# Patient Record
Sex: Female | Born: 1990 | Race: Black or African American | Hispanic: No | Marital: Single | Smoking: Never smoker
Health system: Southern US, Community
[De-identification: ages and names within clinical notes are randomized; demographics above are authoritative.]

## PROBLEM LIST (undated history)

## (undated) ENCOUNTER — Inpatient Hospital Stay (HOSPITAL_COMMUNITY): Payer: Self-pay

---

## 2016-09-21 ENCOUNTER — Encounter (HOSPITAL_COMMUNITY): Payer: Self-pay | Admitting: *Deleted

## 2016-09-21 ENCOUNTER — Inpatient Hospital Stay (HOSPITAL_COMMUNITY)
Admission: AD | Admit: 2016-09-21 | Discharge: 2016-09-21 | Disposition: A | Discharge status: Home / Self Care | Payer: Self-pay | Source: Ambulatory Visit | Attending: Family Medicine | Admitting: Family Medicine

## 2016-09-21 ENCOUNTER — Inpatient Hospital Stay (HOSPITAL_COMMUNITY): Payer: Self-pay

## 2016-09-21 DIAGNOSIS — Z3A08 8 weeks gestation of pregnancy: Secondary | ICD-10-CM | POA: Insufficient documentation

## 2016-09-21 DIAGNOSIS — O209 Hemorrhage in early pregnancy, unspecified: Secondary | ICD-10-CM

## 2016-09-21 DIAGNOSIS — O418X10 Other specified disorders of amniotic fluid and membranes, first trimester, not applicable or unspecified: Secondary | ICD-10-CM

## 2016-09-21 DIAGNOSIS — O98811 Other maternal infectious and parasitic diseases complicating pregnancy, first trimester: Secondary | ICD-10-CM | POA: Insufficient documentation

## 2016-09-21 DIAGNOSIS — B3731 Acute candidiasis of vulva and vagina: Secondary | ICD-10-CM

## 2016-09-21 DIAGNOSIS — O468X1 Other antepartum hemorrhage, first trimester: Secondary | ICD-10-CM

## 2016-09-21 DIAGNOSIS — O4691 Antepartum hemorrhage, unspecified, first trimester: Secondary | ICD-10-CM | POA: Insufficient documentation

## 2016-09-21 DIAGNOSIS — B373 Candidiasis of vulva and vagina: Secondary | ICD-10-CM | POA: Insufficient documentation

## 2016-09-21 DIAGNOSIS — Z3491 Encounter for supervision of normal pregnancy, unspecified, first trimester: Secondary | ICD-10-CM

## 2016-09-21 LAB — WET PREP, GENITAL
Clue Cells Wet Prep HPF POC: NONE SEEN
SPERM: NONE SEEN
TRICH WET PREP: NONE SEEN

## 2016-09-21 LAB — URINALYSIS, ROUTINE W REFLEX MICROSCOPIC
BILIRUBIN URINE: NEGATIVE
Glucose, UA: NEGATIVE mg/dL
Ketones, ur: NEGATIVE mg/dL
Nitrite: NEGATIVE
Protein, ur: NEGATIVE mg/dL
SPECIFIC GRAVITY, URINE: 1.005 (ref 1.005–1.030)
pH: 7 (ref 5.0–8.0)

## 2016-09-21 LAB — POCT PREGNANCY, URINE: PREG TEST UR: POSITIVE — AB

## 2016-09-21 LAB — CBC
HEMATOCRIT: 43.6 % (ref 36.0–46.0)
HEMOGLOBIN: 14.4 g/dL (ref 12.0–15.0)
MCH: 28.1 pg (ref 26.0–34.0)
MCHC: 33 g/dL (ref 30.0–36.0)
MCV: 85.2 fL (ref 78.0–100.0)
Platelets: 300 10*3/uL (ref 150–400)
RBC: 5.12 MIL/uL — AB (ref 3.87–5.11)
RDW: 14.4 % (ref 11.5–15.5)
WBC: 10.8 10*3/uL — AB (ref 4.0–10.5)

## 2016-09-21 LAB — HCG, QUANTITATIVE, PREGNANCY: hCG, Beta Chain, Quant, S: 157549 m[IU]/mL — ABNORMAL HIGH (ref <5)

## 2016-09-21 MED ORDER — TERCONAZOLE 0.4 % VA CREA: 1.0000 | TOPICAL_CREAM | Freq: Every day | VAGINAL | 0 refills | Status: AC

## 2016-09-21 NOTE — MAU Note (Addendum)
Woke up having to use the restroom,  Noted blood when wiped.was bright red, now is brownish.  +preg test  At a [facility] last Friday.  Planned to have an abortion next wk

## 2016-09-21 NOTE — MAU Provider Note (Signed)
History   CSN: [id number]  Arrival date and time: 09/21/16 1620   None        Chief Complaint  Patient presents with  . Vaginal Bleeding  . Possible Pregnancy   Pt is a 26 y/o female G1P0 @ [redacted]w[redacted]d who presents to MAU today c/o sudden onset of vaginal bleeding since earlier today ~1pm. Patient reports that she has not soaked through pads or tampons, and only notes the blood upon wiping. The blood was bright red at first, and then became brown in color. Denies fever at home. She reports mild lower left abd pain rated at 3-4/10, without nausea or vomiting nor radiation. She has an appointment next week for taking the abortion pill. Denies urinary sx.           OB History    Gravida Para Term Preterm AB Living   1             SAB TAB Ectopic Multiple Live Births                  No past medical history on file.  No past surgical history on file.  No family history on file.      Social History  Substance Use Topics  . Smoking status: Not on file  . Smokeless tobacco: Not on file  . Alcohol use Not on file    Allergies: Allergies not on file  No prescriptions prior to admission.    Review of Systems  Constitutional: Negative for chills and fever.  Respiratory: Negative for chest tightness and shortness of breath.   Cardiovascular: Negative for chest pain.  Gastrointestinal: Positive for abdominal pain. Negative for blood in stool, constipation, diarrhea, nausea and vomiting.  Genitourinary: Positive for vaginal bleeding. Negative for difficulty urinating, dysuria, hematuria, pelvic pain, vaginal discharge and vaginal pain.   Physical Exam   Blood pressure 117/67, pulse 74, temperature 98.4 F (36.9 C), temperature source Oral, resp. rate 18, weight 223 lb 12 oz (101.5 kg), last menstrual period 07/25/2016, SpO2 100 %.  Physical Exam  Constitutional: She is oriented to person, place, and time. She appears well-developed and well-nourished. No  distress.  HENT:  Head: Normocephalic and atraumatic.  Cardiovascular: Normal rate, regular rhythm, normal heart sounds and intact distal pulses.  Exam reveals no gallop and no friction rub.   No murmur heard. Respiratory: Effort normal and breath sounds normal. No respiratory distress. She has no wheezes. She has no rales. She exhibits no tenderness.  GI: Soft. She exhibits no distension and no mass. There is no tenderness. There is no rebound and no guarding.  Genitourinary: Pelvic exam was performed with patient supine. There is no rash, tenderness or lesion on the right labia. There is no rash, tenderness or lesion on the left labia. Cervix exhibits discharge (light brown). Cervix exhibits no motion tenderness and no friability. Right adnexum displays no mass and no tenderness. Left adnexum displays tenderness (mild). Left adnexum displays no mass. No erythema in the vagina. No foreign body in the vagina. No signs of injury around the vagina. Vaginal discharge (small amount of light brown discharge at introitus) found.  Musculoskeletal: Normal range of motion. She exhibits no edema.  Neurological: She is alert and oriented to person, place, and time.  Skin: Skin is warm and dry.  Psychiatric: She has a normal mood and affect. Her behavior is normal.    MAU Course  Procedures  MDM Will perform [place] to confirm IUP and r/o  ectopic pregnancy. Informed patient that we do not perform abortive services here.  Assessment and Plan  Patient is a 26 y/o female who presents to MAU for new onset vaginal bleeding in setting of 1st trimester pregnancy. Her PE was unremarkable, with her pelvic examination revealing a small amount of light brown bloody discharge from cervix. Wet prep showed presence of yeast.  Lucendia HerrlichLaura Gardner   I confirm that I have verified the information documented in the student PA's note and that I have also personally performed the physical exam and all medical decision making  activities.See below for continued work-up results.   Lab Results Last 24 Hours       Results for orders placed or performed during the hospital encounter of 09/21/16 (from the past 24 hour(s))  Urinalysis, Routine w reflex microscopic     Status: Abnormal   Collection Time: 09/21/16  4:39 PM  Result Value Ref Range   Color, Urine STRAW (A) YELLOW   APPearance CLEAR CLEAR   Specific Gravity, Urine 1.005 1.005 - 1.030   pH 7.0 5.0 - 8.0   Glucose, UA NEGATIVE NEGATIVE mg/dL   Hgb urine dipstick MODERATE (A) NEGATIVE   Bilirubin Urine NEGATIVE NEGATIVE   Ketones, ur NEGATIVE NEGATIVE mg/dL   Protein, ur NEGATIVE NEGATIVE mg/dL   Nitrite NEGATIVE NEGATIVE   Leukocytes, UA LARGE (A) NEGATIVE   RBC / HPF 0-5 0 - 5 RBC/hpf   WBC, UA 0-5 0 - 5 WBC/hpf   Bacteria, UA FEW (A) NONE SEEN   Squamous Epithelial / LPF 0-5 (A) NONE SEEN  Pregnancy, urine POC     Status: Abnormal   Collection Time: 09/21/16  4:45 PM  Result Value Ref Range   Preg Test, Ur POSITIVE (A) NEGATIVE  CBC     Status: Abnormal   Collection Time: 09/21/16  5:22 PM  Result Value Ref Range   WBC 10.8 (H) 4.0 - 10.5 K/uL   RBC 5.12 (H) 3.87 - 5.11 MIL/uL   Hemoglobin 14.4 12.0 - 15.0 g/dL   HCT 21.343.6 08.636.0 - 57.846.0 %   MCV 85.2 78.0 - 100.0 fL   MCH 28.1 26.0 - 34.0 pg   MCHC 33.0 30.0 - 36.0 g/dL   RDW 46.914.4 62.911.5 - 52.815.5 %   Platelets 300 150 - 400 K/uL  hCG, quantitative, pregnancy     Status: Abnormal   Collection Time: 09/21/16  5:22 PM  Result Value Ref Range   hCG, Beta Chain, Quant, S 157,549 (H) <5 mIU/mL  ABO/Rh     Status: None (Preliminary result)   Collection Time: 09/21/16  5:22 PM  Result Value Ref Range   ABO/RH(D) O POS   Wet prep, genital     Status: Abnormal   Collection Time: 09/21/16  6:45 PM  Result Value Ref Range   Yeast Wet Prep HPF POC PRESENT (A) NONE SEEN   Trich, Wet Prep NONE SEEN NONE SEEN   Clue Cells Wet Prep HPF POC NONE SEEN NONE SEEN    WBC, Wet Prep HPF POC MANY (A) NONE SEEN   Sperm NONE SEEN       [place] Ob Comp Less 14 Wks  Result Date: 09/21/2016 CLINICAL DATA:  Vaginal bleeding today. Positive urine pregnancy test. Quantitative beta HCG 157, 549. LMP was05/27/2018. Gestational age by LMP is8 weeks 2 days. EDC by LMP is03/04/2017. EXAM: OBSTETRIC <14 WK US AND TRANSVAGINAL OB US TECHNIQUE: Both transabdominal and transvaginal ultrasound examinations were performed for complete evaluation of the  gestation as well as the maternal uterus, adnexal regions, and pelvic cul-de-sac. Transvaginal technique was performed to assess early pregnancy. COMPARISON:  None. FINDINGS: Intrauterine gestational sac: Present Yolk sac:  Present Embryo:  Present Cardiac Activity: Present Heart Rate: 183  bpm CRL:  18  mm   8 w   2 d                  [place] EDC: 05/01/2017 Subchorionic hemorrhage: Small subchorionic hemorrhage is identified. Maternal uterus/adnexae: Ovaries are normal in appearance. Left corpus luteum cyst is present. No free pelvic fluid. IMPRESSION: 1. Single living intrauterine embryo. 2. Today's ultrasound confirms clinical EDC of 05/01/2017. 3. Small subchorionic hemorrhage. Electronically Signed   By: Norva Pavlov M.D.   On: 09/21/2016 20:39   US Ob Transvaginal  Result Date: 09/21/2016 CLINICAL DATA:  Vaginal bleeding today. Positive urine pregnancy test. Quantitative beta HCG 157, 549. LMP was05/27/2018. Gestational age by LMP is8 weeks 2 days. EDC by LMP is03/04/2017. EXAM: OBSTETRIC <14 WK [place] AND TRANSVAGINAL OB US TECHNIQUE: Both transabdominal and transvaginal ultrasound examinations were performed for complete evaluation of the gestation as well as the maternal uterus, adnexal regions, and pelvic cul-de-sac. Transvaginal technique was performed to assess early pregnancy. COMPARISON:  None. FINDINGS: Intrauterine gestational sac: Present Yolk sac:  Present Embryo:  Present Cardiac Activity: Present Heart Rate: 183  bpm CRL:   18  mm   8 w   2 d                  [place] EDC: 05/01/2017 Subchorionic hemorrhage: Small subchorionic hemorrhage is identified. Maternal uterus/adnexae: Ovaries are normal in appearance. Left corpus luteum cyst is present. No free pelvic fluid. IMPRESSION: 1. Single living intrauterine embryo. 2. Today's ultrasound confirms clinical EDC of 05/01/2017. 3. Small subchorionic hemorrhage. Electronically Signed   By: Norva Pavlov M.D.   On: 09/21/2016 20:39    A:  1. Vaginal candidiasis   2. Vaginal bleeding in pregnancy, first trimester   3. Normal IUP (intrauterine pregnancy) on prenatal ultrasound, first trimester   4. Subchorionic hemorrhage of placenta in first trimester, single or unspecified fetus    P: Treat candidiasis with Terazol 7 Pt to f/u with OB/Gyn as desired or for termination as scheduled Return to MAU as needed for emergencies   Sharen Counter, CNM 8:50 PM 09/21/2016, 6:29 PM

## 2016-09-21 NOTE — Progress Notes (Signed)
History     CSN: [id number]  Arrival date and time: 09/21/16 1620   None     Chief Complaint  Patient presents with  . Vaginal Bleeding  . Possible Pregnancy   Pt is a 26 y/o female G1P0 @ 6928w2d who presents to MAU today c/o sudden onset of vaginal bleeding since earlier today ~1pm. Patient reports that she has not soaked through pads or tampons, and only notes the blood upon wiping. The blood was bright red at first, and then became brown in color. Denies fever at home. She reports mild lower left abd pain rated at 3-4/10, without nausea or vomiting nor radiation. She has an appointment next week for taking the abortion pill. Denies urinary sx.    OB History    Gravida Para Term Preterm AB Living   1             SAB TAB Ectopic Multiple Live Births                  No past medical history on file.  No past surgical history on file.  No family history on file.  Social History  Substance Use Topics  . Smoking status: Not on file  . Smokeless tobacco: Not on file  . Alcohol use Not on file    Allergies: Allergies not on file  No prescriptions prior to admission.    Review of Systems  Constitutional: Negative for chills and fever.  Respiratory: Negative for chest tightness and shortness of breath.   Cardiovascular: Negative for chest pain.  Gastrointestinal: Positive for abdominal pain. Negative for blood in stool, constipation, diarrhea, nausea and vomiting.  Genitourinary: Positive for vaginal bleeding. Negative for difficulty urinating, dysuria, hematuria, pelvic pain, vaginal discharge and vaginal pain.   Physical Exam   Blood pressure 117/67, pulse 74, temperature 98.4 F (36.9 C), temperature source Oral, resp. rate 18, weight 223 lb 12 oz (101.5 kg), last menstrual period 07/25/2016, SpO2 100 %.  Physical Exam  Constitutional: She is oriented to person, place, and time. She appears well-developed and well-nourished. No distress.  HENT:  Head:  Normocephalic and atraumatic.  Cardiovascular: Normal rate, regular rhythm, normal heart sounds and intact distal pulses.  Exam reveals no gallop and no friction rub.   No murmur heard. Respiratory: Effort normal and breath sounds normal. No respiratory distress. She has no wheezes. She has no rales. She exhibits no tenderness.  GI: Soft. She exhibits no distension and no mass. There is no tenderness. There is no rebound and no guarding.  Genitourinary: Pelvic exam was performed with patient supine. There is no rash, tenderness or lesion on the right labia. There is no rash, tenderness or lesion on the left labia. Cervix exhibits discharge (light brown). Cervix exhibits no motion tenderness and no friability. Right adnexum displays no mass and no tenderness. Left adnexum displays tenderness (mild). Left adnexum displays no mass. No erythema in the vagina. No foreign body in the vagina. No signs of injury around the vagina. Vaginal discharge (small amount of light brown discharge at introitus) found.  Musculoskeletal: Normal range of motion. She exhibits no edema.  Neurological: She is alert and oriented to person, place, and time.  Skin: Skin is warm and dry.  Psychiatric: She has a normal mood and affect. Her behavior is normal.    MAU Course  Procedures  MDM Will perform US to confirm IUP and r/o ectopic pregnancy. Informed patient that we do not perform abortive services here.  Assessment and Plan  Patient is a 26 y/o female who presents to MAU for new onset vaginal bleeding in setting of 1st trimester pregnancy. Her PE was unremarkable, with her pelvic examination revealing a small amount of light brown bloody discharge from cervix. Wet prep showed presence of yeast.  Lucendia Herrlich 09/21/2016, 6:29 PM

## 2016-09-21 NOTE — Discharge Instructions (Signed)
Subchorionic Hematoma °A subchorionic hematoma is a gathering of blood between the outer wall of the placenta and the inner wall of the womb (uterus). The placenta is the organ that connects the fetus to the wall of the uterus. The placenta performs the feeding, breathing (oxygen to the fetus), and waste removal (excretory work) of the fetus. °Subchorionic hematoma is the most common abnormality found on a result from ultrasonography done during the first trimester or early second trimester of pregnancy. If there has been little or no vaginal bleeding, early small hematomas usually shrink on their own and do not affect your baby or pregnancy. The blood is gradually absorbed over 1-2 weeks. When bleeding starts later in pregnancy or the hematoma is larger or occurs in an older pregnant woman, the outcome may not be as good. Larger hematomas may get bigger, which increases the chances for miscarriage. Subchorionic hematoma also increases the risk of premature detachment of the placenta from the uterus, preterm (premature) labor, and stillbirth. °Follow these instructions at home: °· Stay on bed rest if your health care provider recommends this. Although bed rest will not prevent more bleeding or prevent a miscarriage, your health care provider may recommend bed rest until you are advised otherwise. °· Avoid heavy lifting (more than 10 lb [4.5 kg]), exercise, sexual intercourse, or douching as directed by your health care provider. °· Keep track of the number of pads you use each day and how soaked (saturated) they are. Write down this information. °· Do not use tampons. °· Keep all follow-up appointments as directed by your health care provider. Your health care provider may ask you to have follow-up blood tests or ultrasound tests or both. °Get help right away if: °· You have severe cramps in your stomach, back, abdomen, or pelvis. °· You have a fever. °· You pass large clots or tissue. Save any tissue for your  health care provider to look at. °· Your bleeding increases or you become lightheaded, feel weak, or have fainting episodes. °This information is not intended to replace advice given to you by your health care provider. Make sure you discuss any questions you have with your health care provider. °Document Released: 06/02/2006 Document Revised: 07/24/2015 Document Reviewed: 09/14/2012 °Elsevier Interactive Patient Education © 2017 [employer] ° °

## 2016-09-22 LAB — ABO/RH: ABO/RH(D): O POS

## 2016-09-22 LAB — HIV ANTIBODY (ROUTINE TESTING W REFLEX): HIV Screen 4th Generation wRfx: NONREACTIVE

## 2016-09-22 LAB — GC/CHLAMYDIA PROBE AMP (~~LOC~~) NOT AT ARMC
Chlamydia: NEGATIVE
Neisseria Gonorrhea: NEGATIVE

## 2016-09-23 LAB — CULTURE, OB URINE: Culture: 80000 — AB

## 2017-07-24 ENCOUNTER — Encounter (HOSPITAL_COMMUNITY): Payer: Self-pay

## 2018-11-05 IMAGING — US US OB COMP LESS 14 WK
1 series · 15 of 28 positions shown · non-contrast
Comparison: None.

CLINICAL DATA: Vaginal bleeding today. Positive urine pregnancy
test. Quantitative beta HCG 157, 549.

LMP was07/25/2016.
Gestational age by LMP is8 weeks 2 days.
EDC by LMP is05/01/2017.
EXAM:
OBSTETRIC <14 WK US AND TRANSVAGINAL OB US
TECHNIQUE: Both transabdominal and transvaginal ultrasound examinations were
performed for complete evaluation of the gestation as well as the
maternal uterus, adnexal regions, and pelvic cul-de-sac.
Transvaginal technique was performed to assess early pregnancy.

[Series 1: us ob comp less 14 wk · 15 of 64 slices shown]
[im 1/64]
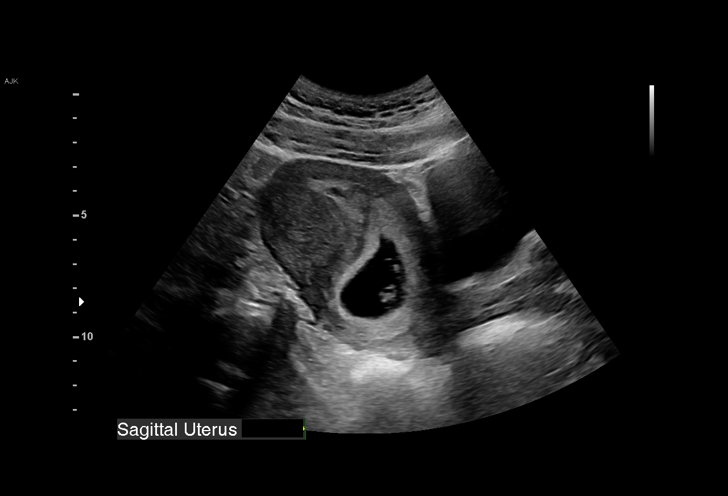
[im 5/64]
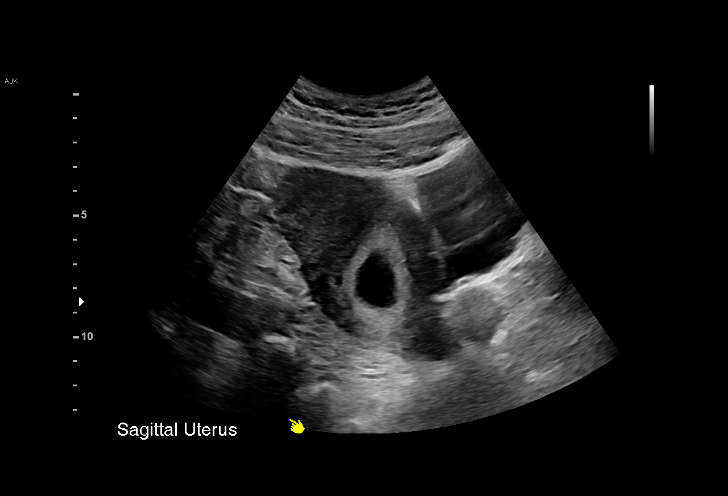
[im 10/64]
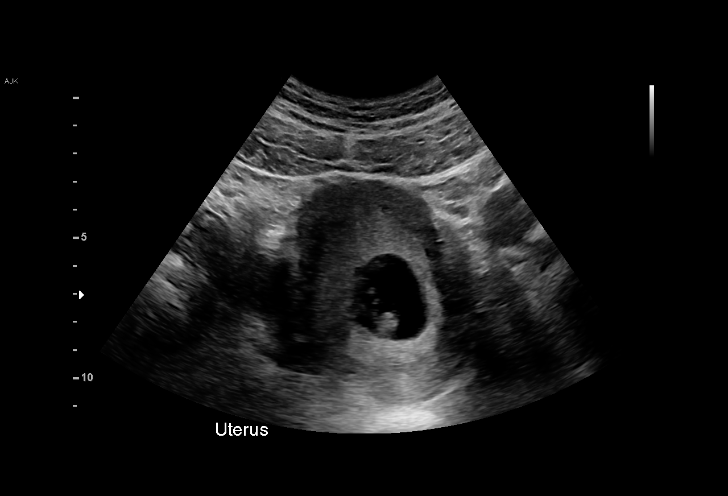
[im 15/64]
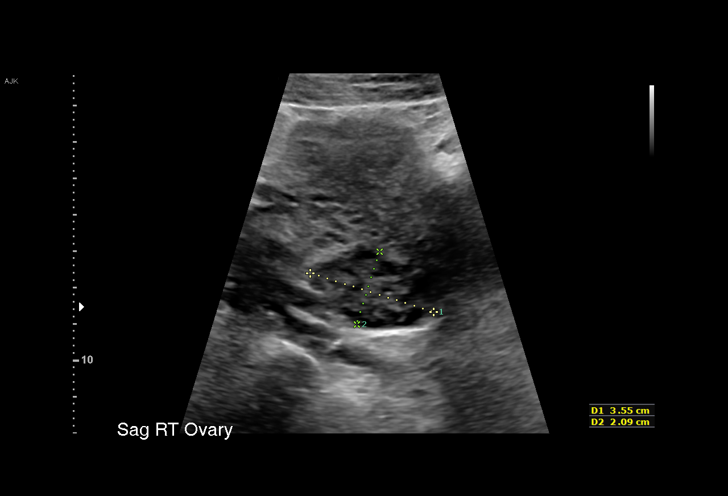
[im 19/64]
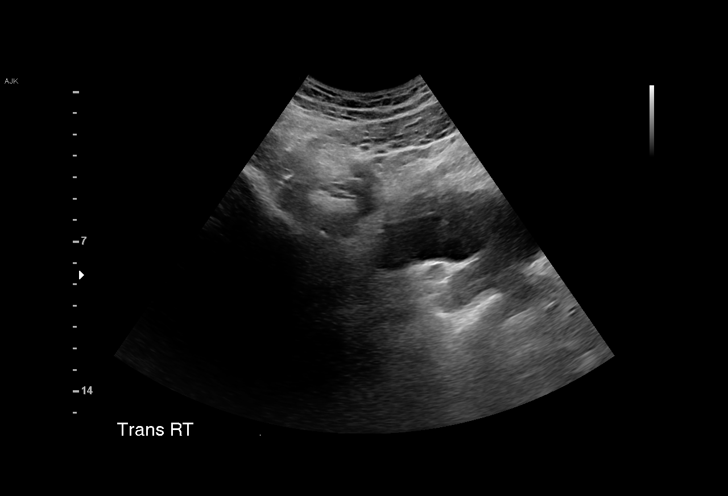
[im 24/64]
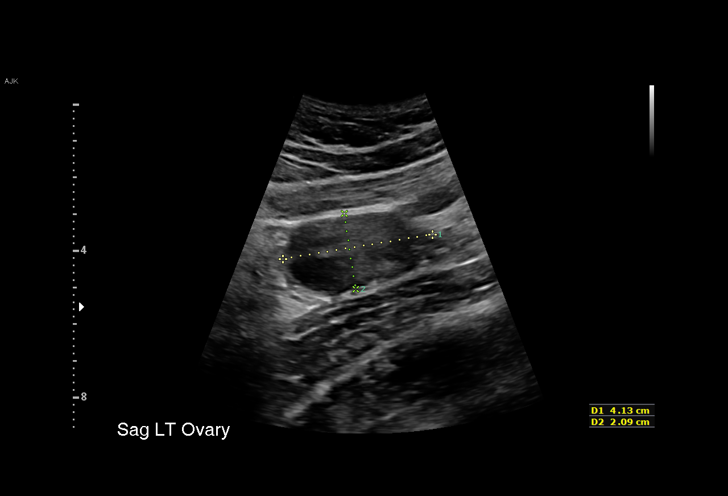
[im 29/64]
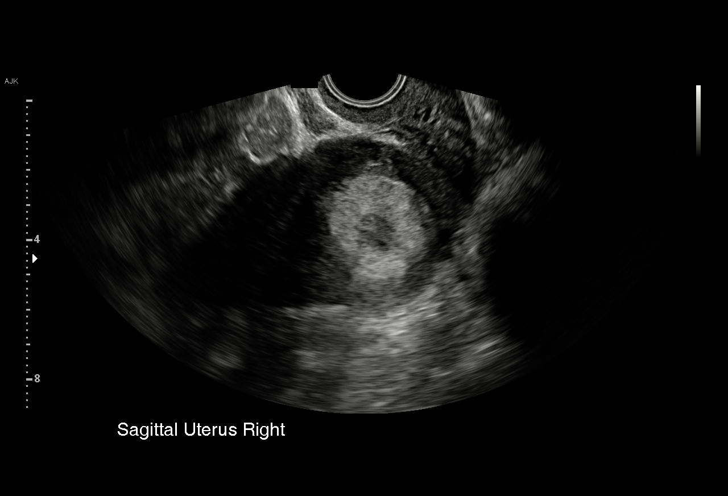
[im 33/64]
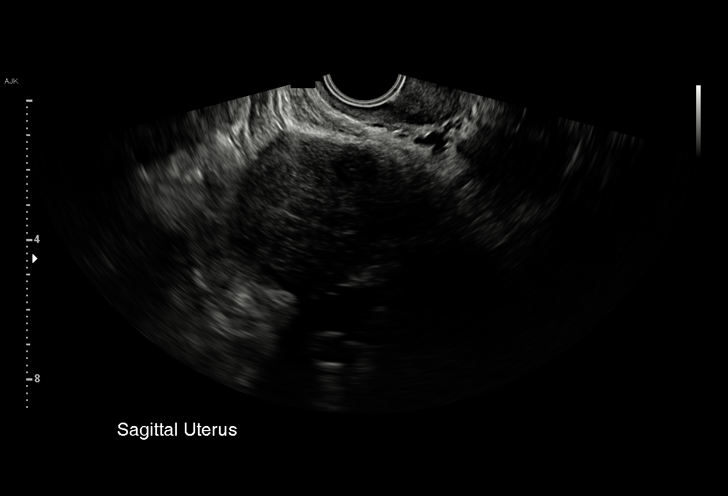
[im 36/64]
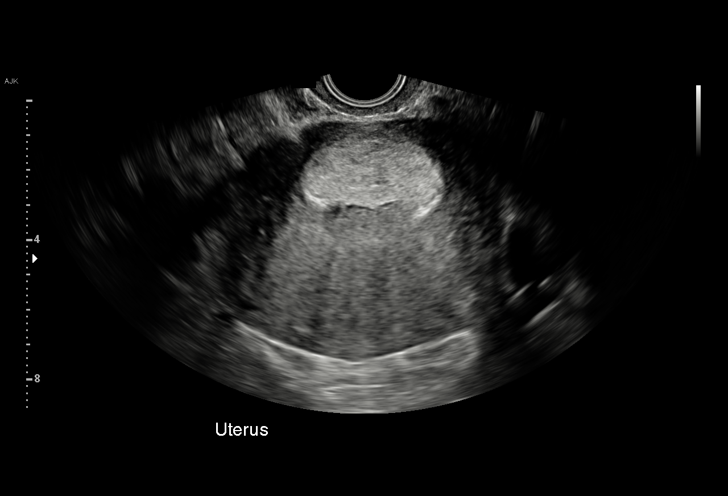
[im 40/64]
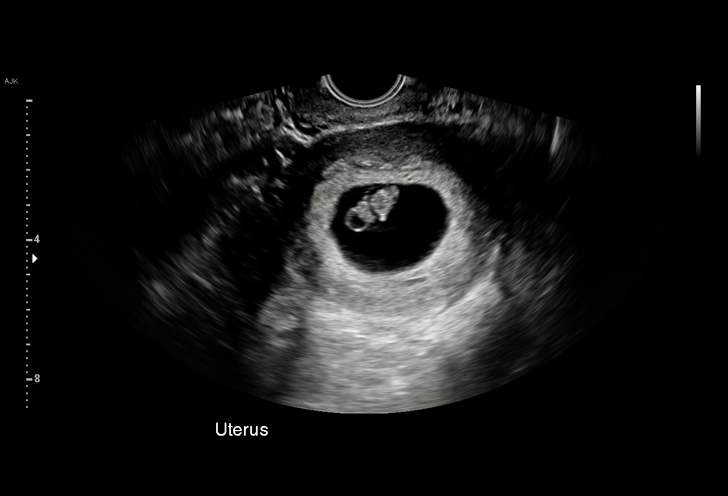
[im 45/64]
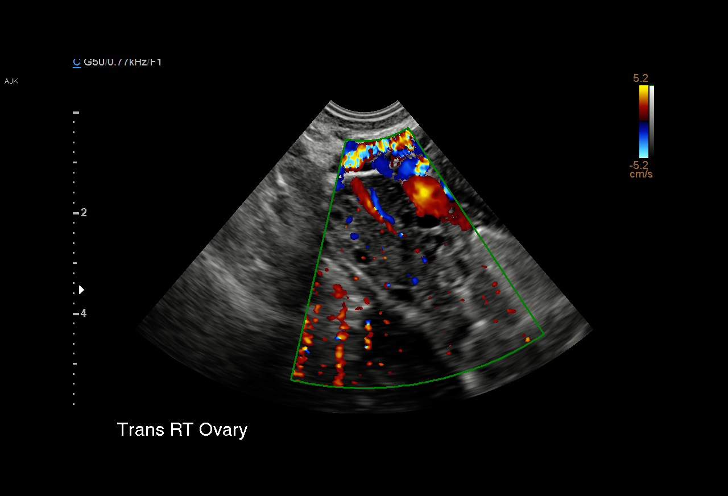
[im 50/64]
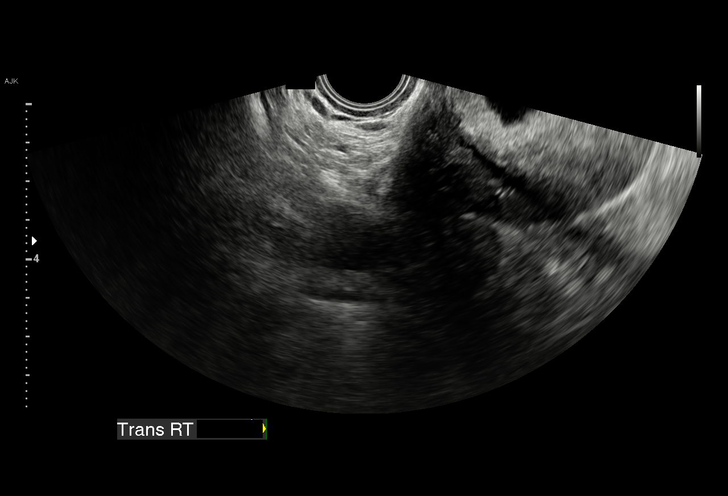
[im 54/64]
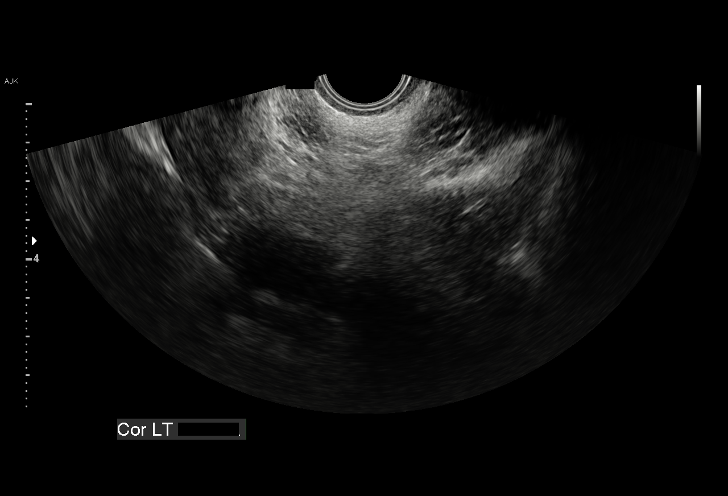
[im 59/64]
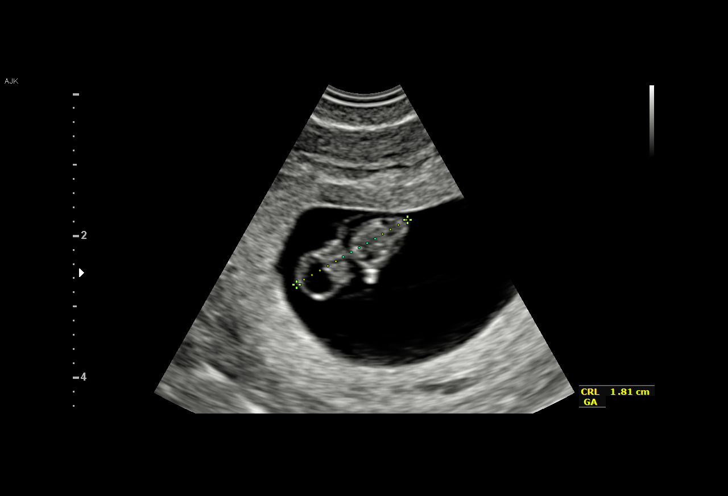
[im 64/64]
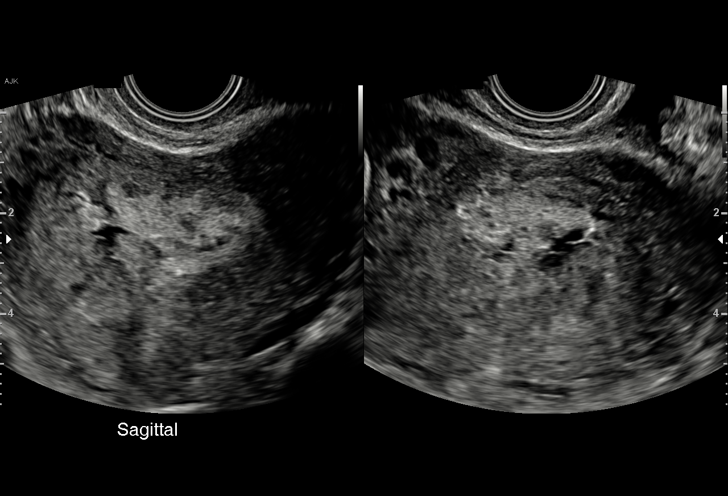

[15 of 28 positions shown; findings below may reference images not displayed]

FINDINGS: Intrauterine gestational sac: Present

Yolk sac:  Present

Embryo:  Present

Cardiac Activity: Present

Heart Rate: 183  bpm

CRL:  18  mm   8 w   2 d                  US EDC: 05/01/2017

Subchorionic hemorrhage: Small subchorionic hemorrhage is
identified.

Maternal uterus/adnexae: Ovaries are normal in appearance. Left
corpus luteum cyst is present. No free pelvic fluid.
IMPRESSION: 1. Single living intrauterine embryo.
2. Today's ultrasound confirms clinical EDC of 05/01/2017.
3. Small subchorionic hemorrhage.
